# Patient Record
Sex: Male | Born: 1965 | Race: White | Hispanic: No | Smoking: Current every day smoker
Health system: Southern US, Community
[De-identification: ages and names within clinical notes are randomized; demographics above are authoritative.]

## PROBLEM LIST (undated history)

## (undated) DIAGNOSIS — K219 Gastro-esophageal reflux disease without esophagitis: Secondary | ICD-10-CM

## (undated) HISTORY — PX: TONSILLECTOMY: SUR1361

---

## 2005-06-21 ENCOUNTER — Emergency Department: Payer: Self-pay | Admitting: Emergency Medicine

## 2005-08-07 ENCOUNTER — Emergency Department: Payer: Self-pay | Admitting: Emergency Medicine

## 2008-12-24 ENCOUNTER — Emergency Department (HOSPITAL_COMMUNITY): Admission: EM | Admit: 2008-12-24 | Discharge: 2008-12-24 | Payer: Self-pay | Admitting: Emergency Medicine

## 2009-04-23 ENCOUNTER — Emergency Department (HOSPITAL_COMMUNITY): Admission: EM | Admit: 2009-04-23 | Discharge: 2009-04-23 | Payer: Self-pay | Admitting: Emergency Medicine

## 2009-09-19 ENCOUNTER — Emergency Department: Payer: Self-pay | Admitting: Emergency Medicine

## 2011-11-02 IMAGING — CT CT HEAD WITHOUT CONTRAST
2 series · 16 of 30 positions shown, 20 images · non-contrast
Comparison: none

REASON FOR EXAM: injury
COMMENTS:   LMP: (Male)

PROCEDURE:     CT  - CT HEAD WITHOUT CONTRAST  - September 19, 2009  [DATE]
RESULT:
HISTORY: Trauma.
PROCEDURE AND FINDINGS:  Standard nonenhanced CT was obtained. No mass
lesion is noted. There is no hydrocephalus. No hemorrhage. No bony
abnormality. Soft tissue swelling is noted over the left posterior parietal
region. No underlying fracture.

[Series 2: without · axial · non-contrast · 0.44mm/px · z∈[-37,+93]mm · 13 of 32 slices shown, 17 images]
[im 3/32  brain]
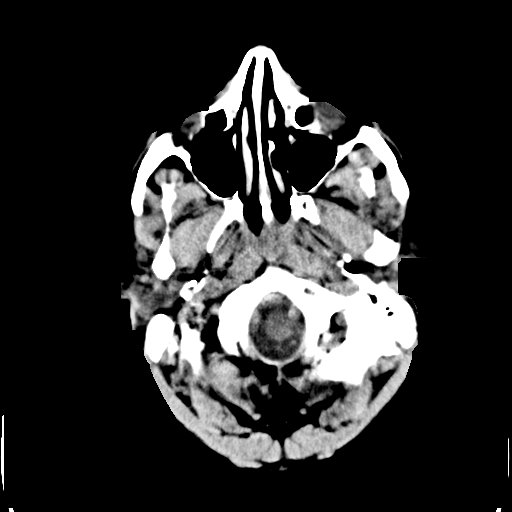
[im 3/32  bone]
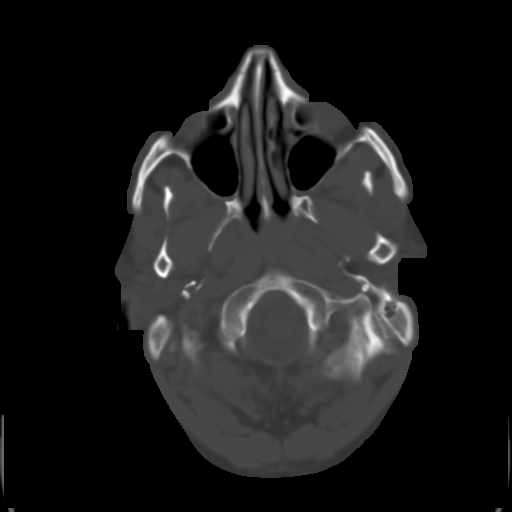
[im 5/32  brain]
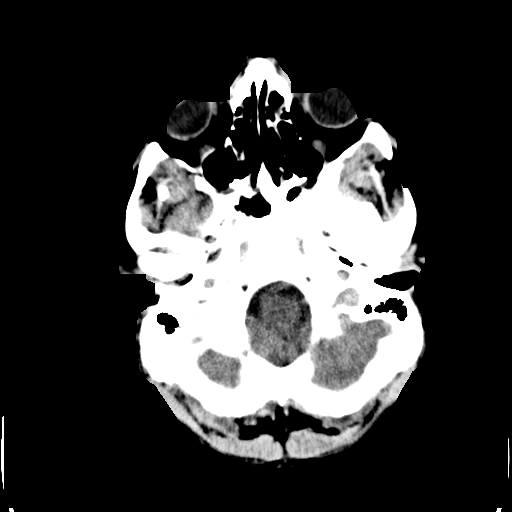
[im 7/32  brain]
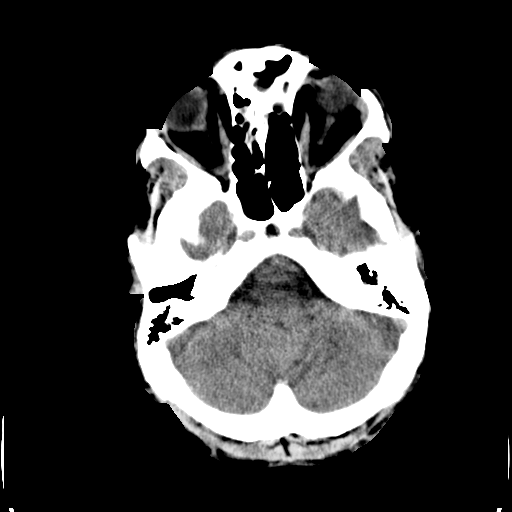
[im 9/32  brain]
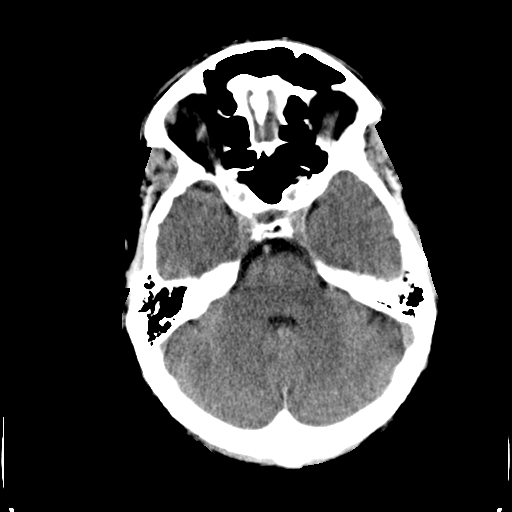
[im 12/32  brain]
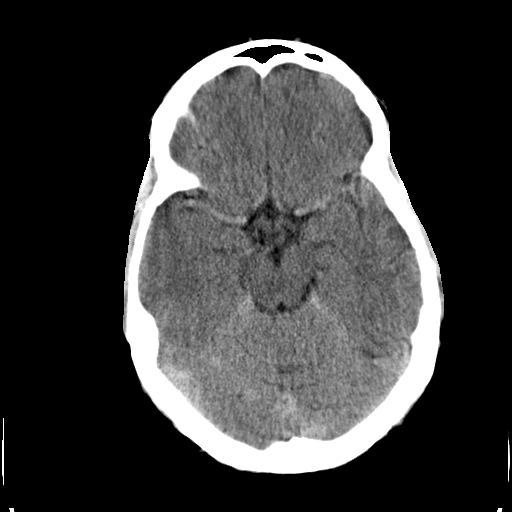
[im 12/32  bone]
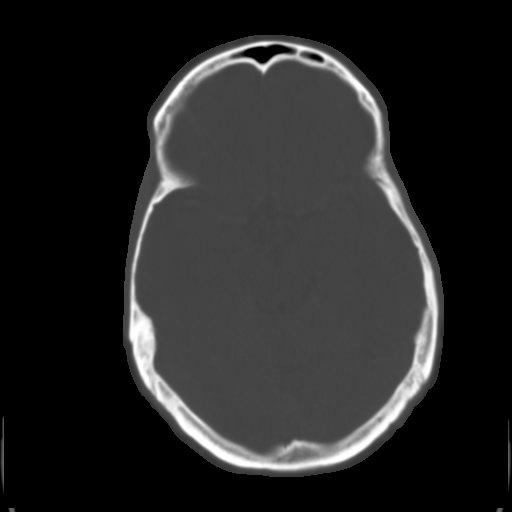
[im 14/32  brain]
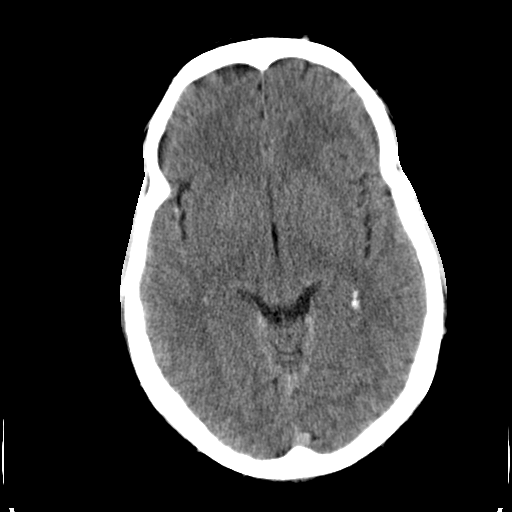
[im 16/32  brain]
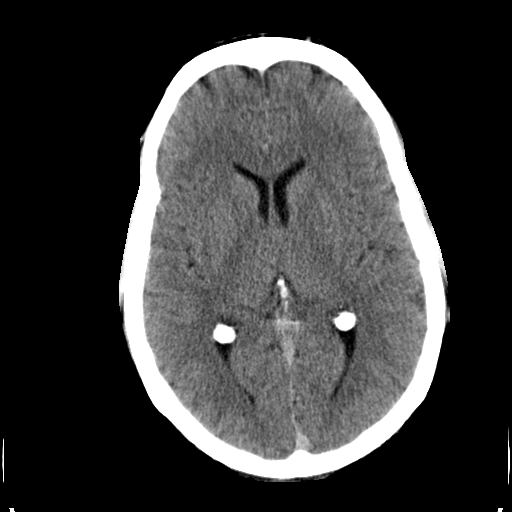
[im 18/32  brain]
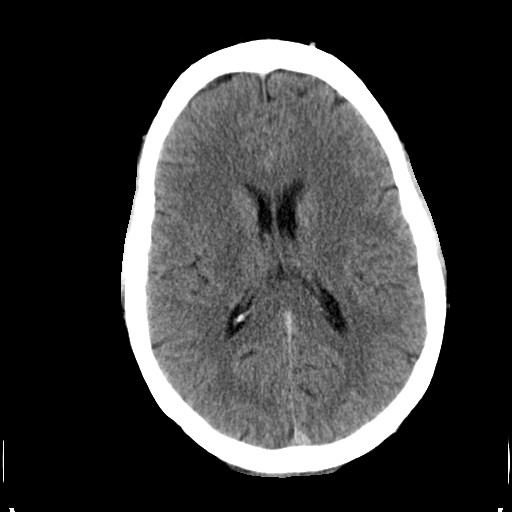
[im 20/32  brain]
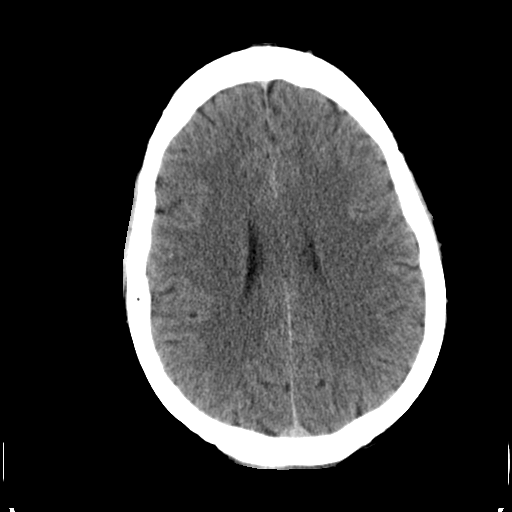
[im 20/32  bone]
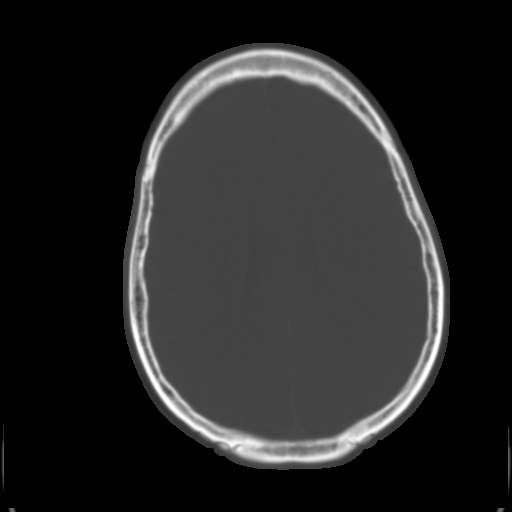
[im 23/32  brain]
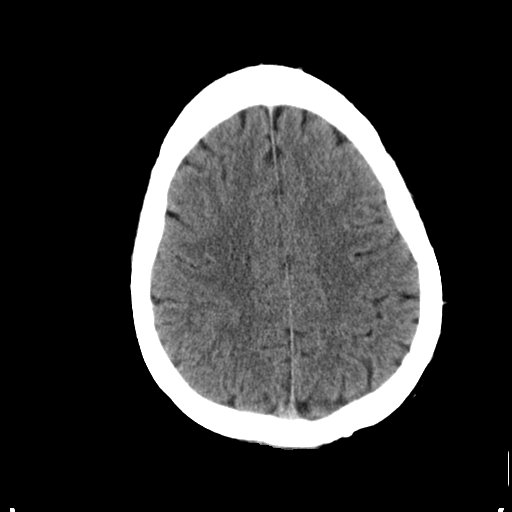
[im 25/32  brain]
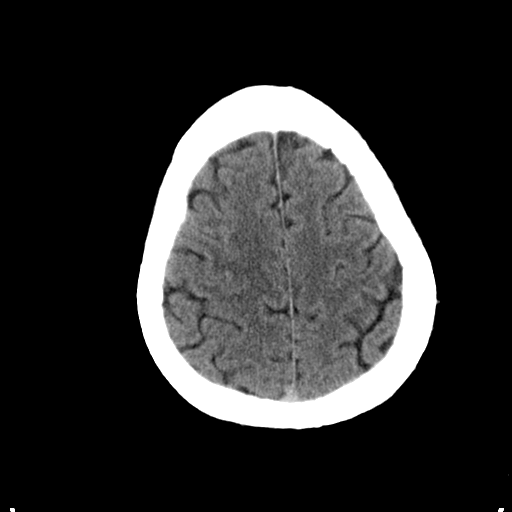
[im 27/32  brain]
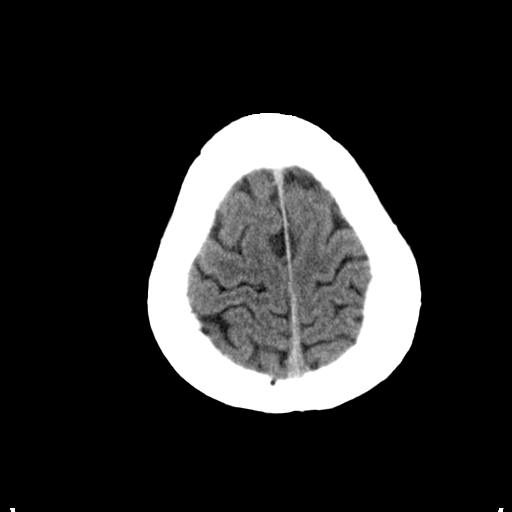
[im 29/32  brain]
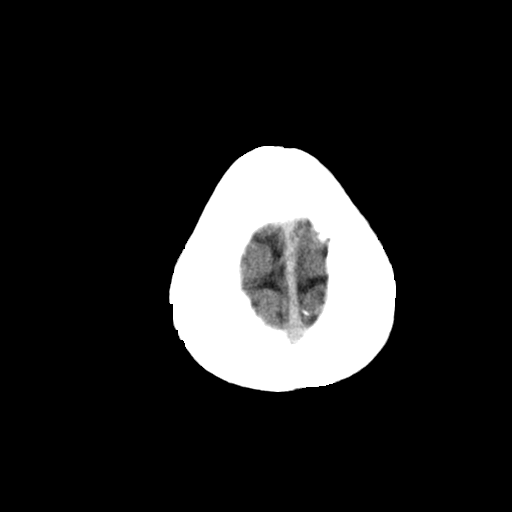
[im 29/32  bone]
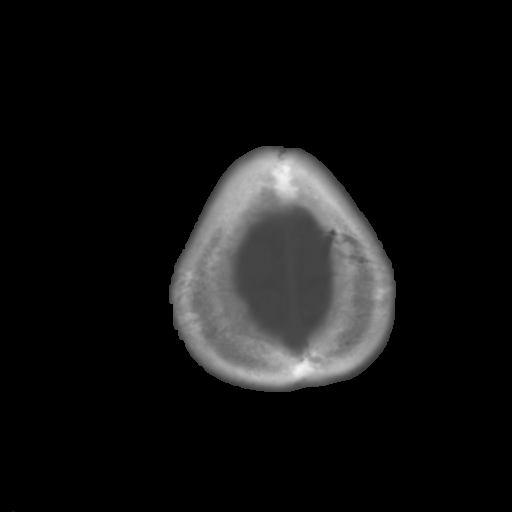

[Series 3: bone · axial · 0.44mm/px · z∈[-37,+8]mm · 3 of 32 slices shown]
[im 3/32  bone]
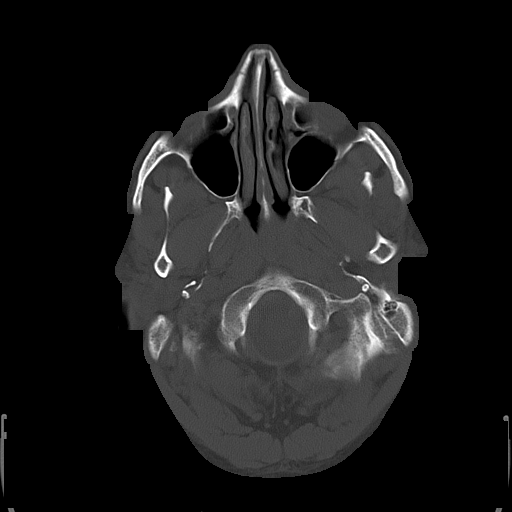
[im 7/32  bone]
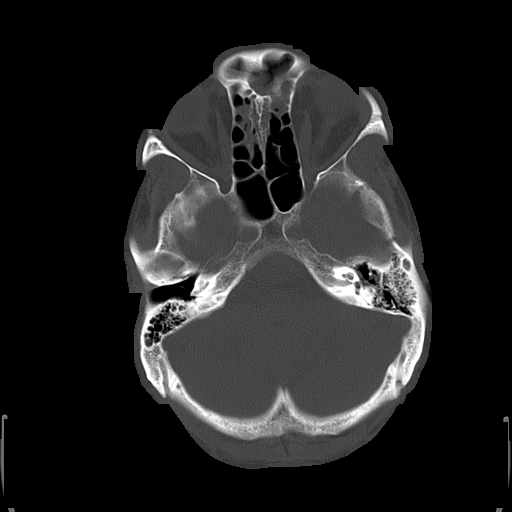
[im 12/32  bone]
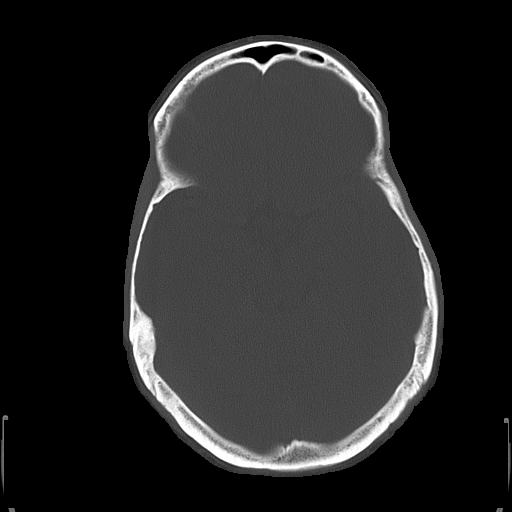

[16 of 30 positions shown; findings below may reference images not displayed]

IMPRESSION: No acute abnormality.

## 2017-05-27 ENCOUNTER — Emergency Department: Payer: Self-pay

## 2017-05-27 ENCOUNTER — Encounter: Payer: Self-pay | Admitting: Emergency Medicine

## 2017-05-27 ENCOUNTER — Inpatient Hospital Stay
Admission: EM | Admit: 2017-05-27 | Discharge: 2017-05-28 | DRG: 247 | Disposition: A | Discharge status: Home / Self Care | Payer: Self-pay | Attending: Internal Medicine | Admitting: Internal Medicine

## 2017-05-27 ENCOUNTER — Other Ambulatory Visit: Payer: Self-pay

## 2017-05-27 ENCOUNTER — Encounter
Admission: EM | Disposition: A | Discharge status: Home / Self Care | Payer: Self-pay | Source: Home / Self Care | Attending: Internal Medicine

## 2017-05-27 DIAGNOSIS — R778 Other specified abnormalities of plasma proteins: Secondary | ICD-10-CM

## 2017-05-27 DIAGNOSIS — F17210 Nicotine dependence, cigarettes, uncomplicated: Secondary | ICD-10-CM | POA: Diagnosis present

## 2017-05-27 DIAGNOSIS — R7989 Other specified abnormal findings of blood chemistry: Secondary | ICD-10-CM

## 2017-05-27 DIAGNOSIS — Z8249 Family history of ischemic heart disease and other diseases of the circulatory system: Secondary | ICD-10-CM

## 2017-05-27 DIAGNOSIS — E785 Hyperlipidemia, unspecified: Secondary | ICD-10-CM | POA: Diagnosis present

## 2017-05-27 DIAGNOSIS — Z8719 Personal history of other diseases of the digestive system: Secondary | ICD-10-CM

## 2017-05-27 DIAGNOSIS — I214 Non-ST elevation (NSTEMI) myocardial infarction: Principal | ICD-10-CM | POA: Diagnosis present

## 2017-05-27 DIAGNOSIS — R079 Chest pain, unspecified: Secondary | ICD-10-CM

## 2017-05-27 DIAGNOSIS — Z880 Allergy status to penicillin: Secondary | ICD-10-CM

## 2017-05-27 HISTORY — PX: CORONARY STENT INTERVENTION: CATH118234

## 2017-05-27 HISTORY — DX: Gastro-esophageal reflux disease without esophagitis: K21.9

## 2017-05-27 HISTORY — PX: LEFT HEART CATH AND CORONARY ANGIOGRAPHY: CATH118249

## 2017-05-27 LAB — CBC WITH DIFFERENTIAL/PLATELET
Basophils Absolute: 0.1 10*3/uL (ref 0–0.1)
Basophils Relative: 1 %
EOS ABS: 0.2 10*3/uL (ref 0–0.7)
EOS PCT: 2 %
HCT: 42.1 % (ref 40.0–52.0)
Hemoglobin: 14.4 g/dL (ref 13.0–18.0)
LYMPHS ABS: 4.5 10*3/uL — AB (ref 1.0–3.6)
LYMPHS PCT: 57 %
MCH: 28.6 pg (ref 26.0–34.0)
MCHC: 34.1 g/dL (ref 32.0–36.0)
MCV: 83.7 fL (ref 80.0–100.0)
MONO ABS: 0.8 10*3/uL (ref 0.2–1.0)
Monocytes Relative: 10 %
Neutro Abs: 2.4 10*3/uL (ref 1.4–6.5)
Neutrophils Relative %: 30 %
PLATELETS: 210 10*3/uL (ref 150–440)
RBC: 5.02 MIL/uL (ref 4.40–5.90)
RDW: 14.6 % — AB (ref 11.5–14.5)
WBC: 7.9 10*3/uL (ref 3.8–10.6)

## 2017-05-27 LAB — APTT: aPTT: 28 seconds (ref 24–36)

## 2017-05-27 LAB — COMPREHENSIVE METABOLIC PANEL
ALK PHOS: 91 U/L (ref 38–126)
ALT: 39 U/L (ref 17–63)
AST: 37 U/L (ref 15–41)
Albumin: 3.2 g/dL — ABNORMAL LOW (ref 3.5–5.0)
Anion gap: 7 (ref 5–15)
BUN: 12 mg/dL (ref 6–20)
CALCIUM: 8.5 mg/dL — AB (ref 8.9–10.3)
CHLORIDE: 108 mmol/L (ref 101–111)
CO2: 22 mmol/L (ref 22–32)
CREATININE: 0.98 mg/dL (ref 0.61–1.24)
GFR calc Af Amer: >60 mL/min (ref >60)
Glucose, Bld: 142 mg/dL — ABNORMAL HIGH (ref 65–99)
Potassium: 3.6 mmol/L (ref 3.5–5.1)
Sodium: 137 mmol/L (ref 135–145)
Total Bilirubin: 0.7 mg/dL (ref 0.3–1.2)
Total Protein: 5.9 g/dL — ABNORMAL LOW (ref 6.5–8.1)

## 2017-05-27 LAB — TROPONIN I
Troponin I: 0.56 ng/mL (ref <0.03)
Troponin I: 1.11 ng/mL (ref <0.03)
Troponin I: 46.21 ng/mL (ref <0.03)

## 2017-05-27 LAB — POCT ACTIVATED CLOTTING TIME: Activated Clotting Time: 428 s

## 2017-05-27 LAB — PROTIME-INR
INR: 1.03
Prothrombin Time: 13.4 seconds (ref 11.4–15.2)

## 2017-05-27 SURGERY — LEFT HEART CATH AND CORONARY ANGIOGRAPHY
Anesthesia: Moderate Sedation

## 2017-05-27 MED ORDER — METOPROLOL TARTRATE 25 MG PO TABS
25.0000 mg | ORAL_TABLET | Freq: Two times a day (BID) | ORAL | Status: DC
  Administered 2017-05-27 – 2017-05-28 (×2): 25 mg via ORAL
  Filled 2017-05-27 (×2): qty 1

## 2017-05-27 MED ORDER — HEPARIN (PORCINE) IN NACL 2-0.9 UNIT/ML-% IJ SOLN
INTRAMUSCULAR | Status: AC
  Filled 2017-05-27: qty 500

## 2017-05-27 MED ORDER — ASPIRIN 81 MG PO CHEW
81.0000 mg | CHEWABLE_TABLET | Freq: Every day | ORAL | Status: DC
  Administered 2017-05-28: 81 mg via ORAL
  Filled 2017-05-27: qty 1

## 2017-05-27 MED ORDER — TICAGRELOR 90 MG PO TABS
ORAL_TABLET | ORAL | Status: AC
Start: 2017-05-27 — End: ?
  Filled 2017-05-27: qty 2

## 2017-05-27 MED ORDER — ACETAMINOPHEN 325 MG PO TABS
650.0000 mg | ORAL_TABLET | ORAL | Status: DC | PRN
  Administered 2017-05-28: 650 mg via ORAL
  Filled 2017-05-27: qty 2

## 2017-05-27 MED ORDER — ATORVASTATIN CALCIUM 80 MG PO TABS
80.0000 mg | ORAL_TABLET | Freq: Every day | ORAL | Status: DC
  Filled 2017-05-27: qty 1
  Filled 2017-05-27: qty 2

## 2017-05-27 MED ORDER — HEPARIN BOLUS VIA INFUSION
4000.0000 [IU] | Freq: Once | INTRAVENOUS | Status: AC
  Administered 2017-05-27: 4000 [IU] via INTRAVENOUS
  Filled 2017-05-27: qty 4000

## 2017-05-27 MED ORDER — SODIUM CHLORIDE 0.9% FLUSH
3.0000 mL | Freq: Two times a day (BID) | INTRAVENOUS | Status: DC
  Administered 2017-05-28: 3 mL via INTRAVENOUS

## 2017-05-27 MED ORDER — MIDAZOLAM HCL 2 MG/2ML IJ SOLN
INTRAMUSCULAR | Status: DC | PRN
  Administered 2017-05-27: 1 mg via INTRAVENOUS

## 2017-05-27 MED ORDER — NITROGLYCERIN 0.4 MG SL SUBL
0.4000 mg | SUBLINGUAL_TABLET | SUBLINGUAL | Status: DC | PRN
  Administered 2017-05-27 (×3): 0.4 mg via SUBLINGUAL
  Filled 2017-05-27 (×3): qty 1

## 2017-05-27 MED ORDER — IOPAMIDOL (ISOVUE-300) INJECTION 61%
INTRAVENOUS | Status: DC | PRN
  Administered 2017-05-27: 100 mL via INTRA_ARTERIAL

## 2017-05-27 MED ORDER — BIVALIRUDIN BOLUS VIA INFUSION - CUPID
INTRAVENOUS | Status: DC | PRN
  Administered 2017-05-27: 63.6 mg via INTRAVENOUS

## 2017-05-27 MED ORDER — HEPARIN (PORCINE) IN NACL 100-0.45 UNIT/ML-% IJ SOLN
1100.0000 [IU]/h | INTRAMUSCULAR | Status: DC
  Administered 2017-05-27: 1100 [IU]/h via INTRAVENOUS
  Filled 2017-05-27 (×2): qty 250

## 2017-05-27 MED ORDER — ONDANSETRON HCL 4 MG/2ML IJ SOLN: 4.0000 mg | Freq: Four times a day (QID) | INTRAMUSCULAR | Status: DC | PRN

## 2017-05-27 MED ORDER — ASPIRIN 81 MG PO CHEW
CHEWABLE_TABLET | ORAL | Status: AC
  Administered 2017-05-27: 14:00:00
  Filled 2017-05-27: qty 1

## 2017-05-27 MED ORDER — ONDANSETRON HCL 4 MG PO TABS: 4.0000 mg | ORAL_TABLET | Freq: Four times a day (QID) | ORAL | Status: DC | PRN

## 2017-05-27 MED ORDER — NITROGLYCERIN 1 MG/10 ML FOR IR/CATH LAB
INTRA_ARTERIAL | Status: DC | PRN
  Administered 2017-05-27: 100 ug via INTRACORONARY

## 2017-05-27 MED ORDER — LIDOCAINE HCL (PF) 1 % IJ SOLN
INTRAMUSCULAR | Status: AC
  Filled 2017-05-27: qty 30

## 2017-05-27 MED ORDER — NITROGLYCERIN 2 % TD OINT
0.5000 [in_us] | TOPICAL_OINTMENT | Freq: Four times a day (QID) | TRANSDERMAL | Status: DC
  Administered 2017-05-27 – 2017-05-28 (×2): 0.5 [in_us] via TOPICAL
  Filled 2017-05-27 (×2): qty 1

## 2017-05-27 MED ORDER — NITROGLYCERIN 5 MG/ML IV SOLN
INTRAVENOUS | Status: AC
  Filled 2017-05-27: qty 10

## 2017-05-27 MED ORDER — BIVALIRUDIN TRIFLUOROACETATE 250 MG IV SOLR
0.2500 mg/kg/h | INTRAVENOUS | Status: AC
  Filled 2017-05-27: qty 250

## 2017-05-27 MED ORDER — SODIUM CHLORIDE 0.9% FLUSH: 3.0000 mL | Freq: Two times a day (BID) | INTRAVENOUS | Status: DC

## 2017-05-27 MED ORDER — GI COCKTAIL ~~LOC~~
30.0000 mL | Freq: Once | ORAL | Status: AC
  Administered 2017-05-27: 30 mL via ORAL
  Filled 2017-05-27: qty 30

## 2017-05-27 MED ORDER — TICAGRELOR 90 MG PO TABS
ORAL_TABLET | ORAL | Status: DC | PRN
  Administered 2017-05-27: 180 mg via ORAL

## 2017-05-27 MED ORDER — TICAGRELOR 90 MG PO TABS
90.0000 mg | ORAL_TABLET | Freq: Two times a day (BID) | ORAL | Status: DC
  Administered 2017-05-27 – 2017-05-28 (×2): 90 mg via ORAL
  Filled 2017-05-27 (×2): qty 1

## 2017-05-27 MED ORDER — SODIUM CHLORIDE 0.9 % WEIGHT BASED INFUSION
1.0000 mL/kg/h | INTRAVENOUS | Status: AC
  Administered 2017-05-27: 1 mL/kg/h via INTRAVENOUS

## 2017-05-27 MED ORDER — MIDAZOLAM HCL 2 MG/2ML IJ SOLN
INTRAMUSCULAR | Status: AC
  Filled 2017-05-27: qty 2

## 2017-05-27 MED ORDER — ONDANSETRON HCL 4 MG/2ML IJ SOLN
4.0000 mg | Freq: Once | INTRAMUSCULAR | Status: AC | PRN
  Administered 2017-05-27: 4 mg via INTRAVENOUS
  Filled 2017-05-27: qty 2

## 2017-05-27 MED ORDER — SODIUM CHLORIDE 0.9% FLUSH: 3.0000 mL | INTRAVENOUS | Status: DC | PRN

## 2017-05-27 MED ORDER — SODIUM CHLORIDE 0.9 % WEIGHT BASED INFUSION: 3.0000 mL/kg/h | INTRAVENOUS | Status: DC

## 2017-05-27 MED ORDER — FENTANYL CITRATE (PF) 100 MCG/2ML IJ SOLN
INTRAMUSCULAR | Status: AC
  Filled 2017-05-27: qty 2

## 2017-05-27 MED ORDER — SODIUM CHLORIDE 0.9 % IV SOLN
INTRAVENOUS | Status: AC | PRN
  Administered 2017-05-27: 1.75 mg/kg/h via INTRAVENOUS

## 2017-05-27 MED ORDER — SODIUM CHLORIDE 0.9 % IV SOLN
INTRAVENOUS | Status: DC
  Administered 2017-05-27: 13:00:00 via INTRAVENOUS

## 2017-05-27 MED ORDER — ASPIRIN 81 MG PO CHEW: 81.0000 mg | CHEWABLE_TABLET | ORAL | Status: DC

## 2017-05-27 MED ORDER — DOCUSATE SODIUM 100 MG PO CAPS
100.0000 mg | ORAL_CAPSULE | Freq: Two times a day (BID) | ORAL | Status: DC
  Administered 2017-05-27 – 2017-05-28 (×2): 100 mg via ORAL
  Filled 2017-05-27 (×2): qty 1

## 2017-05-27 MED ORDER — BISACODYL 5 MG PO TBEC
5.0000 mg | DELAYED_RELEASE_TABLET | Freq: Every day | ORAL | Status: DC | PRN
  Filled 2017-05-27: qty 1

## 2017-05-27 MED ORDER — SODIUM CHLORIDE 0.9 % IV SOLN: 250.0000 mL | INTRAVENOUS | Status: DC | PRN

## 2017-05-27 MED ORDER — MORPHINE SULFATE (PF) 4 MG/ML IV SOLN
4.0000 mg | Freq: Once | INTRAVENOUS | Status: AC | PRN
  Administered 2017-05-27: 4 mg via INTRAVENOUS
  Filled 2017-05-27: qty 1

## 2017-05-27 MED ORDER — LABETALOL HCL 5 MG/ML IV SOLN: 10.0000 mg | INTRAVENOUS | Status: AC | PRN

## 2017-05-27 MED ORDER — ACETAMINOPHEN 325 MG PO TABS: 650.0000 mg | ORAL_TABLET | Freq: Four times a day (QID) | ORAL | Status: DC | PRN

## 2017-05-27 MED ORDER — FENTANYL CITRATE (PF) 100 MCG/2ML IJ SOLN
INTRAMUSCULAR | Status: DC | PRN
  Administered 2017-05-27: 25 ug via INTRAVENOUS

## 2017-05-27 MED ORDER — ACETAMINOPHEN 650 MG RE SUPP: 650.0000 mg | Freq: Four times a day (QID) | RECTAL | Status: DC | PRN

## 2017-05-27 MED ORDER — BIVALIRUDIN TRIFLUOROACETATE 250 MG IV SOLR
INTRAVENOUS | Status: AC
Start: 2017-05-27 — End: ?
  Filled 2017-05-27: qty 250

## 2017-05-27 MED ORDER — SODIUM CHLORIDE 0.9 % WEIGHT BASED INFUSION: 1.0000 mL/kg/h | INTRAVENOUS | Status: DC

## 2017-05-27 MED ORDER — BIVALIRUDIN TRIFLUOROACETATE 250 MG IV SOLR
INTRAVENOUS | Status: AC
  Filled 2017-05-27: qty 250

## 2017-05-27 SURGICAL SUPPLY — 18 items
BALLN TREK RX 2.5X12 (BALLOONS) ×3
BALLN ~~LOC~~ TREK RX 3.0X12 (BALLOONS) ×3
BALLOON TREK RX 2.5X12 (BALLOONS) ×1 IMPLANT
BALLOON ~~LOC~~ TREK RX 3.0X12 (BALLOONS) ×1 IMPLANT
CATH INFINITI 5FR ANG PIGTAIL (CATHETERS) ×3 IMPLANT
CATH INFINITI 5FR JL4 (CATHETERS) ×3 IMPLANT
CATH INFINITI JR4 5F (CATHETERS) ×3 IMPLANT
CATH VISTA GUIDE 6FR XB3.5 (CATHETERS) ×3 IMPLANT
DEVICE CLOSURE MYNXGRIP 6/7F (Vascular Products) ×3 IMPLANT
DEVICE INFLAT 30 PLUS (MISCELLANEOUS) ×3 IMPLANT
KIT MANI 3VAL PERCEP (MISCELLANEOUS) ×3 IMPLANT
NEEDLE PERC 18GX7CM (NEEDLE) ×3 IMPLANT
PACK CARDIAC CATH (CUSTOM PROCEDURE TRAY) ×3 IMPLANT
SHEATH AVANTI 5FR X 11CM (SHEATH) ×3 IMPLANT
SHEATH AVANTI 6FR X 11CM (SHEATH) ×3 IMPLANT
STENT SIERRA 2.50 X 15 MM (Permanent Stent) ×3 IMPLANT
WIRE EMERALD 3MM-J .035X150CM (WIRE) ×3 IMPLANT
WIRE RUNTHROUGH .014X180CM (WIRE) ×3 IMPLANT

## 2017-05-27 NOTE — ED Notes (Signed)
Pt up to bathroom at this time. Family at bedside. Will continue to monitor.

## 2017-05-27 NOTE — ED Notes (Signed)
MD made aware of critical troponin 

## 2017-05-27 NOTE — ED Notes (Signed)
Critical troponin called from the lab.  Troponin 0.56

## 2017-05-27 NOTE — Progress Notes (Signed)
ANTICOAGULATION CONSULT NOTE - Initial Consult  Pharmacy Consult for bivalirudin Indication: chest pain/ACS  Allergies  Allergen Reactions  . Penicillins Other (See Comments)    Has patient had a PCN reaction causing immediate rash, facial/tongue/throat swelling, SOB or lightheadedness with hypotension: No Has patient had a PCN reaction causing severe rash involving mucus membranes or skin necrosis: No Has patient had a PCN reaction that required hospitalization: No Has patient had a PCN reaction occurring within the last 10 years: No If all of the above answers are "NO", then may proceed with Cephalosporin use.    Patient Measurements: Height: 5\' 8"  (172.7 cm) Weight: 187 lb (84.8 kg) IBW/kg (Calculated) : 68.4 Heparin Dosing Weight:   Vital Signs: Temp: 98.2 F (36.8 C) (11/30 1422) Temp Source: Oral (11/30 0941) BP: 140/102 (11/30 1422) Pulse Rate: 73 (11/30 1422)  Labs: Recent Labs    05/27/17 0733 05/27/17 1129 05/27/17 1300  HGB 14.4  --   --   HCT 42.1  --   --   PLT 210  --   --   APTT  --   --  28  LABPROT  --   --  13.4  INR  --   --  1.03  CREATININE 0.98  --   --   TROPONINI <0.03 0.56* 1.11*    Estimated Creatinine Clearance: 96.7 mL/min (by C-G formula based on SCr of 0.98 mg/dL).   Medical History: Past Medical History:  Diagnosis Date  . GERD (gastroesophageal reflux disease)     Medications:  Infusions:  . sodium chloride 50 mL/hr at 05/27/17 1328  . sodium chloride    . [START ON 05/28/2017] sodium chloride     Followed by  . [START ON 05/28/2017] sodium chloride    . bivalirudin (ANGIOMAX) infusion 5 mg/mL (Cath Lab,ACS,PCI indication)    . heparin 1,100 Units/hr (05/27/17 1332)    Assessment: 51 yom with NSTEMI now post cath, pharmacy consulted to dose Angiomax x 2 hours post cath. Confirmed that heparin d/c has been ordered.   Goal of Therapy:  Monitor platelets by anticoagulation protocol: Yes   Plan:  Bivalirudin 0.25  mg/kg/hr x 2 hours post cath. Scheduled end time is 18:30.  Bruce FrostNathan A Alhaji Black, Pharm.D., BCPS Clinical Pharmacist 05/27/2017,4:24 PM

## 2017-05-27 NOTE — Consult Note (Signed)
[facility]  CARDIOLOGY CONSULT NOTE  Patient ID: Bruce BeaverJeffrey M Shull MRN: [id number] DOB/AGE: [birth year] 51 y.o.  Admit date: 05/27/2017 Referring Physician Dr. Shaune PollackLord Primary Physician   Primary Cardiologist   Reason for Consultation nstemi  HPI: pt is a 51 yo male with no prior cardiac problems who presented to the er with complaints of chest pain that occurred this am while have sexual intercourse. Pain radiated to his left arm and was 10/10 with diapharesis. Presented to er where ekg showed no injury current. Pain was relieved with ntg and morphine and gi cocktail. Initial troponin was normal. Subsequent troponin increased to 1.11.   Review of Systems  Constitutional: Negative.   HENT: Negative.   Eyes: Negative.   Respiratory: Negative.   Cardiovascular: Positive for chest pain.  Gastrointestinal: Negative.   Genitourinary: Negative.   Musculoskeletal: Negative.   Skin: Negative.   Neurological: Negative.   Endo/Heme/Allergies: Negative.   Psychiatric/Behavioral: Negative.     Past Medical History:  Diagnosis Date  . GERD (gastroesophageal reflux disease)     History reviewed. No pertinent family history.  Social History   Socioeconomic History  . Marital status: Divorced    Spouse name: Not on file  . Number of children: Not on file  . Years of education: Not on file  . Highest education level: Not on file  Social Needs  . Financial resource strain: Not on file  . Food insecurity - worry: Not on file  . Food insecurity - inability: Not on file  . Transportation needs - medical: Not on file  . Transportation needs - non-medical: Not on file  Occupational History  . Not on file  Tobacco Use  . Smoking status: Current Every Day Smoker    Packs/day: 0.50    Types: Cigarettes  Substance and Sexual Activity  . Alcohol use: No    Frequency: Never  . Drug use: No  . Sexual activity: Yes  Other Topics Concern  .  Not on file  Social History Narrative  . Not on file      No medications prior to admission.    Physical Exam: Blood pressure (!) 140/102, pulse 73, temperature 98.2 F (36.8 C), resp. rate 18, height 5\' 8"  (1.727 m), weight 84.8 kg (187 lb), SpO2 97 %.   Wt Readings from Last 1 Encounters:  05/27/17 84.8 kg (187 lb)     General appearance: alert and cooperative Resp: clear to auscultation bilaterally Chest wall: no tenderness Cardio: regular rate and rhythm GI: soft, non-tender; bowel sounds normal; no masses,  no organomegaly Extremities: extremities normal, atraumatic, no cyanosis or edema Neurologic: Grossly normal  Labs:   Lab Results  Component Value Date   WBC 7.9 05/27/2017   HGB 14.4 05/27/2017   HCT 42.1 05/27/2017   MCV 83.7 05/27/2017   PLT 210 05/27/2017    Recent Labs  Lab 05/27/17 0733  NA 137  K 3.6  CL 108  CO2 22  BUN 12  CREATININE 0.98  CALCIUM 8.5*  PROT 5.9*  BILITOT 0.7  ALKPHOS 91  ALT 39  AST 37  GLUCOSE 142*   Lab Results  Component Value Date   TROPONINI 1.11 (HH) 05/27/2017      Radiology: no acute cardiopulmonary disease EKG: nsr with no ischemia  ASSESSMENT AND PLAN:  Pt with no prior cardiac history who presented with chest pain. Has ruled in for nstemi. Proceed with left heart cath due  to rest pain and elevated troponin. Has received heparin and asa. Further recs after cath.   Signed: Dalia HeadingKenneth A Cyrah Mclamb MD, [facility] 05/27/2017, 3:42 PM

## 2017-05-27 NOTE — Progress Notes (Signed)
12 lead EKG done. Right groin clean, dry, intact, without hematoma, edema, drainage, ecchymosis,.erythema. Pt. Denies c/o CP,SOB, N/V, dizziness, HA on arrival from cath lab.

## 2017-05-27 NOTE — H&P (Signed)
[facility] [place] [facility]   PATIENT NAME: Bruce Black    MR#:  [id number]  DATE OF BIRTH:  [birth year]  DATE OF ADMISSION:  05/27/2017  PRIMARY CARE PHYSICIAN: Patient, No Pcp Per   REQUESTING/REFERRING PHYSICIAN: Dr.Rebecca Lord  CHIEF COMPLAINT: Chest pain   Chief Complaint  Patient presents with  . Chest Pain    HISTORY OF PRESENT ILLNESS:  Bruce Black  is a 51 y.o. male no significant past medical history presented with chest pressure, started around 6 AM.  Patient had central chest pressure associated with nausea, sweating, dizziness.  Chest pressure was 8 out of 10 in severity radiated to the left arm.  EMS gave 4 baby aspirin, in the ER patient received 3 sublingual nitro, GI cocktail without much improvement of chest pain.  First set of troponins are negative but second troponin at 0.56.  So ER physician spoke with Dr. Lady GaryFath who is on-call for cardiology, recommended heparin drip, admission.  PAST MEDICAL HISTORY:   Past Medical History:  Diagnosis Date  . GERD (gastroesophageal reflux disease)     PAST SURGICAL HISTOIRY:   none SOCIAL HISTORY:   Social History   Tobacco Use  . Smoking status: Current Every Day Smoker    Packs/day: 0.50    Types: Cigarettes  Substance Use Topics  . Alcohol use: No    Frequency: Never    FAMILY HISTORY:  History reviewed. No pertinent family history. History significant for both mother and father had heart attacks above age 49, father had coronary artery disease with stent placement. DRUG ALLERGIES:   Allergies  Allergen Reactions  . Penicillins Other (See Comments)    Has patient had a PCN reaction causing immediate rash, facial/tongue/throat swelling, SOB or lightheadedness with hypotension: No Has patient had a PCN reaction causing severe rash involving mucus membranes or skin necrosis: No Has patient had a PCN reaction that required hospitalization: No Has patient had a PCN  reaction occurring within the last 10 years: No If all of the above answers are "NO", then may proceed with Cephalosporin use.    REVIEW OF SYSTEMS:  CONSTITUTIONAL: No fever, fatigue or weakness.  EYES: No blurred or double vision.  EARS, NOSE, AND THROAT: No tinnitus or ear pain.  RESPIRATORY: No cough, shortness of breath, wheezing or hemoptysis.  CARDIOVASCULAR: Chest pain, nausea and vomiting today morning.  GASTROINTESTINAL: No nausea, vomiting, diarrhea or abdominal pain.  GENITOURINARY: No dysuria, hematuria.  ENDOCRINE: No polyuria, nocturia,  HEMATOLOGY: No anemia, easy bruising or bleeding SKIN: No rash or lesion. MUSCULOSKELETAL: No joint pain or arthritis.   NEUROLOGIC: No tingling, numbness, weakness.  PSYCHIATRY: No anxiety or depression.   MEDICATIONS AT HOME:   Prior to Admission medications   Not on File      VITAL SIGNS:  Blood pressure (!) 146/98, pulse 74, temperature 98.7 F (37.1 C), temperature source Oral, resp. rate 20, height 5\' 8"  (1.727 m), weight 84.8 kg (187 lb), SpO2 99 %.  PHYSICAL EXAMINATION:  GENERAL:  51 y.o.-year-old patient lying in the bed with no acute distress.  EYES: Pupils equal, round, reactive to light and accommodation. No scleral icterus. Extraocular muscles intact.  HEENT: Head atraumatic, normocephalic. Oropharynx and nasopharynx clear.  NECK:  Supple, no jugular venous distention. No thyroid enlargement, no tenderness.  LUNGS: Normal breath sounds bilaterally, no wheezing, rales,rhonchi or crepitation. No use of accessory muscles of respiration.  CARDIOVASCULAR: S1, S2 normal. No murmurs, rubs, or gallops.  ABDOMEN:  Soft, nontender, nondistended. Bowel sounds present. No organomegaly or mass.  EXTREMITIES: No pedal edema, cyanosis, or clubbing.  NEUROLOGIC: Cranial nerves II through XII are intact. Muscle strength 5/5 in all extremities. Sensation intact. Gait not checked.  PSYCHIATRIC: The patient is alert and oriented x 3.   SKIN: No obvious rash, lesion, or ulcer.   LABORATORY PANEL:   CBC Recent Labs  Lab 05/27/17 0733  WBC 7.9  HGB 14.4  HCT 42.1  PLT 210   ------------------------------------------------------------------------------------------------------------------  Chemistries  Recent Labs  Lab 05/27/17 0733  NA 137  K 3.6  CL 108  CO2 22  GLUCOSE 142*  BUN 12  CREATININE 0.98  CALCIUM 8.5*  AST 37  ALT 39  ALKPHOS 91  BILITOT 0.7   ------------------------------------------------------------------------------------------------------------------  Cardiac Enzymes Recent Labs  Lab 05/27/17 1129  TROPONINI 0.56*   ------------------------------------------------------------------------------------------------------------------  RADIOLOGY:  Dg Chest 2 View  Result Date: 05/27/2017 CLINICAL DATA:  Chest pressure. EXAM: CHEST  2 VIEW COMPARISON:  No prior through FINDINGS: Mediastinum hilar structures normal. Lungs are clear. No pleural effusion or pneumothorax. Heart size normal. Sliding hiatal hernia. IMPRESSION: 1. No acute cardiopulmonary disease. 2. Sliding hiatal hernia. Electronically Signed   By: Maisie Fushomas  Register   On: 05/27/2017 08:18    EKG:   Orders placed or performed during the hospital encounter of 05/27/17  . EKG 12-Lead  . EKG 12-Lead   EKG shows normal sinus rhythm at 74 bpm, no ST-T changes.  IMPRESSION AND PLAN:   51 year old male patient without any significant past medical history, no PCP comes in because of chest pressure associated nausea, vomiting and elevated troponins concerning ACS.  Continue to cycle troponins, admitted to telemetry, continue aspirin, beta-blockers, nitrates, check fasting lipids, obtain cardiology consult, spoke with Dr. Lady GaryFath, continue heparin drip.  Discussed the plan with patient and patient's family.    All the records are reviewed and case discussed with ED provider. Management plans discussed with the patient, family  and they are in agreement.  CODE STATUS: full  TOTAL TIME TAKING CARE OF THIS PATIENT: 55minutes.    Katha HammingSnehalatha Siri Buege M.D on 05/27/2017 at 1:20 PM  Between 7am to 6pm - Pager - [phone]  After 6pm go to [email or web address] - password [email or web address]  [facility]  Office  [phone]  CC: Primary care physician; Patient, No Pcp Per  Note: This dictation was prepared with Dragon dictation along with smaller phrase technology. Any transcriptional errors that result from this process are unintentional.

## 2017-05-27 NOTE — Progress Notes (Signed)
Report given to floor RN, Nickie RetortBernaice. Immediate report given to Wynona Caneshristine, [place] in [facility]. Pt. In no acute distress.

## 2017-05-27 NOTE — ED Notes (Signed)
Pt family member requesting an update.  MD at bedside now

## 2017-05-27 NOTE — ED Notes (Signed)
Admitting doctor at bedside 

## 2017-05-27 NOTE — Progress Notes (Signed)
Report received from Robin, RN.

## 2017-05-27 NOTE — ED Provider Notes (Signed)
[facility] Emergency Department Provider Note ____________________________________________   I have reviewed the triage vital signs and the triage nursing note.  HISTORY  Chief Complaint Chest Pain   Historian Patient  HPI Bruce Black is a 51 y.o. male no significant past medical history, presents with chest pressure that started around 6 AM.  Patient states he had just finished having sex with his girlfriend and started to feel central chest pressure with nausea.  He states he sat in a chair for about 15 minutes and continued to feel bad.  No trouble breathing, no pleuritic chest pain.  He felt numb and tingly in finger tips of both hands.  States he had does have a history of GERD, but this did not feel like heartburn to him.  No known past coronary artery disease or heart attack.  He came in by 911 ambulance, and received 324 milligrams aspirin on the way in.  Patient states he still has chest heaviness "like an elephant sitting on my chest.  "He rates this at about 8 out of 10.  He states he is having some "hot flashes "and feels a little sweaty.   Past Medical History:  Diagnosis Date  . GERD (gastroesophageal reflux disease)     There are no active problems to display for this patient.     Prior to Admission medications   Not on File    Allergies  Allergen Reactions  . Penicillins Other (See Comments)    Has patient had a PCN reaction causing immediate rash, facial/tongue/throat swelling, SOB or lightheadedness with hypotension: No Has patient had a PCN reaction causing severe rash involving mucus membranes or skin necrosis: No Has patient had a PCN reaction that required hospitalization: No Has patient had a PCN reaction occurring within the last 10 years: No If all of the above answers are "NO", then may proceed with Cephalosporin use.    History reviewed. No pertinent family history.  Social History Social History   Tobacco Use   . Smoking status: Current Every Day Smoker    Packs/day: 0.50    Types: Cigarettes  Substance Use Topics  . Alcohol use: No    Frequency: Never  . Drug use: No    Review of Systems  Constitutional: Negative for fever. Eyes: Negative for visual changes. ENT: Negative for sore throat. Cardiovascular: Positive for chest sugar. Respiratory: Negative for shortness of breath. Gastrointestinal: Positive for nausea.  Negative for abdominal pain, vomiting and diarrhea. Genitourinary: Negative for dysuria. Musculoskeletal: Negative for back pain. Skin: Negative for rash. Neurological: Negative for headache.  ____________________________________________   PHYSICAL EXAM:  VITAL SIGNS: ED Triage Vitals  Enc Vitals Group     BP 05/27/17 0729 (!) 141/96     Pulse Rate 05/27/17 0729 74     Resp 05/27/17 0729 20     Temp 05/27/17 0729 98.9 F (37.2 C)     Temp Source 05/27/17 0729 Oral     SpO2 05/27/17 0729 96 %     Weight 05/27/17 0730 187 lb (84.8 kg)     Height 05/27/17 0730 5\' 8"  (1.727 m)     Head Circumference --      Peak Flow --      Pain Score 05/27/17 0725 8     Pain Loc --      Pain Edu? --      Excl. in GC? --      Constitutional: Alert and oriented. Well appearing and in no  distress. HEENT   Head: Normocephalic and atraumatic.  Slightly diaphoretic around the forehead.      Eyes: Conjunctivae are normal. Pupils equal and round.       Ears:         Nose: No congestion/rhinnorhea.   Mouth/Throat: Mucous membranes are moist.   Neck: No stridor. Cardiovascular/Chest: Normal rate, regular rhythm.  No murmurs, rubs, or gallops. Respiratory: Normal respiratory effort without tachypnea nor retractions. Breath sounds are clear and equal bilaterally. No wheezes/rales/rhonchi. Gastrointestinal: Soft. No distention, no guarding, no rebound. Nontender.    Genitourinary/rectal:Deferred Musculoskeletal: Nontender with normal range of motion in all extremities. No  joint effusions.  No lower extremity tenderness.  No edema. Neurologic:  Normal speech and language. No gross or focal neurologic deficits are appreciated. Skin:  Skin is warm, dry and intact. No rash noted. Psychiatric: Mood and affect are normal. Speech and behavior are normal. Patient exhibits appropriate insight and judgment.   ____________________________________________  LABS (pertinent positives/negatives) I, Governor Rooksebecca Arabel Barcenas, MD the attending physician have reviewed the labs noted below.  Labs Reviewed  COMPREHENSIVE METABOLIC PANEL - Abnormal; Notable for the following components:      Result Value   Glucose, Bld 142 (*)    Calcium 8.5 (*)    Total Protein 5.9 (*)    Albumin 3.2 (*)    All other components within normal limits  CBC WITH DIFFERENTIAL/PLATELET - Abnormal; Notable for the following components:   RDW 14.6 (*)    Lymphs Abs 4.5 (*)    All other components within normal limits  TROPONIN I - Abnormal; Notable for the following components:   Troponin I 0.56 (*)    All other components within normal limits  TROPONIN I    ____________________________________________    EKG I, Governor Rooksebecca Camaron Cammack, MD, the attending physician have personally viewed and interpreted all ECGs.  74 bpm.  normal sinus rhythm.  Left axis deviation.  Nonspecific ST and T wave. ____________________________________________  RADIOLOGY All Xrays were viewed by me.  Imaging interpreted by Radiologist, and I, Governor Rooksebecca Willadeen Colantuono, MD the attending physician have reviewed the radiologist interpretation noted below.  Chest x-ray 2 view:  IMPRESSION: 1. No acute cardiopulmonary disease.  2. Sliding hiatal hernia. __________________________________________  PROCEDURES  Procedure(s) performed: None  Critical Care performed: None   ____________________________________________  ED COURSE / ASSESSMENT AND PLAN  Pertinent labs & imaging results that were available during my care of the patient were  reviewed by me and considered in my medical decision making (see chart for details).   Patient with nonspecific chest pressure, associated with diaphoresis and nausea, but no focal chest pain, pleuritic chest pain, breathing difficulties, and complaints of bilateral tingling in his fingertips which is at this point essentially resolved, but the chest pressure still present.  Denies abdominal pain and no abdominal pain on exam.   Patient had minimal relief after 3 sublingual nitros, so morphine was tried.  Studies came back reassuring and initial troponin negative.  I discussed with patient sending her for a repeat.  Patient did have some relief with morphine but was still complaining of discomfort.  GI cocktail was tried and seemed to provide some relief.  I was told by the nurse after pharmacy called with critical value troponin 0 0.56.  I went in to speak with the patient about repeat troponin going up.  At this point he is actually pain controlled, no chest pain at rest.  He states it hurts a little bit if he  presses directly in his chest.  I spoke with Dr. Lady GaryFath, cardiology on-call, we decided to proceed with heparin, and patient was seen by Dr. Lady GaryFath this afternoon.  Patient be admitted to the hospitalist service.    DIFFERENTIAL DIAGNOSIS:Differential diagnosis includes, but is not limited to, ACS, aortic dissection, pulmonary embolism, cardiac tamponade, pneumothorax, pneumonia, pericarditis, myocarditis, GI-related causes including esophagitis/gastritis, and musculoskeletal chest wall pain.    CONSULTATIONS: None   Patient / Family / Caregiver informed of clinical course, medical decision-making process, and agree with plan.         ___________________________________________   FINAL CLINICAL IMPRESSION(S) / ED DIAGNOSES   Final diagnoses:  Nonspecific chest pain  Troponin I above reference range       ___________________________________________        Note: This dictation was prepared with Dragon dictation. Any transcriptional errors that result from this process are unintentional    Governor RooksLord, Phoenix Dresser, MD 05/27/17 1251

## 2017-05-27 NOTE — ED Notes (Signed)
RN informed 2A that pt was going to Cath Lab before going upstairs

## 2017-05-27 NOTE — Progress Notes (Signed)
ANTICOAGULATION CONSULT NOTE - Initial Consult  Pharmacy Consult for heparin Indication: chest pain/ACS  Allergies  Allergen Reactions  . Penicillins Other (See Comments)    Has patient had a PCN reaction causing immediate rash, facial/tongue/throat swelling, SOB or lightheadedness with hypotension: No Has patient had a PCN reaction causing severe rash involving mucus membranes or skin necrosis: No Has patient had a PCN reaction that required hospitalization: No Has patient had a PCN reaction occurring within the last 10 years: No If all of the above answers are "NO", then may proceed with Cephalosporin use.    Patient Measurements: Height: 5\' 8"  (172.7 cm) Weight: 187 lb (84.8 kg) IBW/kg (Calculated) : 68.4 Heparin Dosing Weight: 84.8 kg   Vital Signs: Temp: 98.7 F (37.1 C) (11/30 0941) Temp Source: Oral (11/30 0941) BP: 146/98 (11/30 1203) Pulse Rate: 74 (11/30 1203)  Labs: Recent Labs    05/27/17 0733 05/27/17 1129  HGB 14.4  --   HCT 42.1  --   PLT 210  --   CREATININE 0.98  --   TROPONINI <0.03 0.56*    Estimated Creatinine Clearance: 96.7 mL/min (by C-G formula based on SCr of 0.98 mg/dL).   Medical History: Past Medical History:  Diagnosis Date  . GERD (gastroesophageal reflux disease)     Medications:   Patient is not taking any anticoagulants at home.   Assessment: 51 yo male admitted with chest pain. Patient has minimal PMH of GERD and is not currently taking any scheduled medications at home.  Goal of Therapy:  Heparin level 0.3-0.7 units/ml Monitor platelets by anticoagulation protocol: Yes   Plan:  Give 4000 units bolus x 1 Start heparin infusion at 1100 units/hr Check anti-Xa level in 6 hours and daily while on heparin Continue to monitor H&H and platelets  .Yolanda BonineHannah Lifsey, PharmD Pharmacy Resident 05/27/2017,12:59 PM

## 2017-05-27 NOTE — ED Triage Notes (Signed)
Pt to ER via [facility], he was having intercourse with girlfriends this morning and developed mid sternal chest pain. Pt reports that he felt hot inside. He did get vomit also.

## 2017-05-27 NOTE — ED Notes (Signed)
ED TO INPATIENT HANDOFF REPORT  Name/Age/Gender Ernest Mallick 51 y.o. male  Code Status    Code Status Orders  (From admission, onward)        Start     Ordered   05/27/17 1256  Full code  Continuous     05/27/17 1258    Code Status History    Date Active Date Inactive Code Status Order ID Comments User Context   This patient has a current code status but no historical code status.      Home/SNF/Other Home  Chief Complaint chest pain  Level of Care/Admitting Diagnosis ED Disposition    ED Disposition Condition [facility] Area: [place] [[id number]]  Level of Care: Telemetry [5]  Diagnosis: NSTEMI (non-ST elevation myocardial infarction) [facility]) [[id number]]  Admitting Physician: Epifanio Lesches [[id number]]  Attending Physician: Epifanio Lesches [[id number]]  Estimated length of stay: past midnight tomorrow  Certification:: I certify this patient will need inpatient services for at least 2 midnights  PT Class (Do Not Modify): Inpatient [101]  PT Acc Code (Do Not Modify): Private [1]       Medical History Past Medical History:  Diagnosis Date  . GERD (gastroesophageal reflux disease)     Allergies Allergies  Allergen Reactions  . Penicillins Other (See Comments)    Has patient had a PCN reaction causing immediate rash, facial/tongue/throat swelling, SOB or lightheadedness with hypotension: No Has patient had a PCN reaction causing severe rash involving mucus membranes or skin necrosis: No Has patient had a PCN reaction that required hospitalization: No Has patient had a PCN reaction occurring within the last 10 years: No If all of the above answers are "NO", then may proceed with Cephalosporin use.    IV Location/Drains/Wounds Patient Lines/Drains/Airways Status   Active Line/Drains/Airways    Name:   Placement date:   Placement time:   Site:   Days:   Peripheral IV 05/27/17 Right Forearm   05/27/17    1314    Forearm    less than 1          Labs/Imaging Results for orders placed or performed during the hospital encounter of 05/27/17 (from the past 48 hour(s))  Comprehensive metabolic panel     Status: Abnormal   Collection Time: 05/27/17  7:33 AM  Result Value Ref Range   Sodium 137 135 - 145 mmol/L   Potassium 3.6 3.5 - 5.1 mmol/L   Chloride 108 101 - 111 mmol/L   CO2 22 22 - 32 mmol/L   Glucose, Bld 142 (H) 65 - 99 mg/dL   BUN 12 6 - 20 mg/dL   Creatinine, Ser 0.98 0.61 - 1.24 mg/dL   Calcium 8.5 (L) 8.9 - 10.3 mg/dL   Total Protein 5.9 (L) 6.5 - 8.1 g/dL   Albumin 3.2 (L) 3.5 - 5.0 g/dL   AST 37 15 - 41 U/L   ALT 39 17 - 63 U/L   Alkaline Phosphatase 91 38 - 126 U/L   Total Bilirubin 0.7 0.3 - 1.2 mg/dL   GFR calc non Af Amer >60 >60 mL/min   GFR calc Af Amer >60 >60 mL/min    Comment: (NOTE) The eGFR has been calculated using the CKD EPI equation. This calculation has not been validated in all clinical situations. eGFR's persistently <60 mL/min signify possible Chronic Kidney Disease.    Anion gap 7 5 - 15  Troponin I     Status: None   Collection Time:  05/27/17  7:33 AM  Result Value Ref Range   Troponin I <0.03 <0.03 ng/mL  CBC with Differential     Status: Abnormal   Collection Time: 05/27/17  7:33 AM  Result Value Ref Range   WBC 7.9 3.8 - 10.6 K/uL   RBC 5.02 4.40 - 5.90 MIL/uL   Hemoglobin 14.4 13.0 - 18.0 g/dL   HCT 42.1 40.0 - 52.0 %   MCV 83.7 80.0 - 100.0 fL   MCH 28.6 26.0 - 34.0 pg   MCHC 34.1 32.0 - 36.0 g/dL   RDW 14.6 (H) 11.5 - 14.5 %   Platelets 210 150 - 440 K/uL   Neutrophils Relative % 30 %   Neutro Abs 2.4 1.4 - 6.5 K/uL   Lymphocytes Relative 57 %   Lymphs Abs 4.5 (H) 1.0 - 3.6 K/uL   Monocytes Relative 10 %   Monocytes Absolute 0.8 0.2 - 1.0 K/uL   Eosinophils Relative 2 %   Eosinophils Absolute 0.2 0 - 0.7 K/uL   Basophils Relative 1 %   Basophils Absolute 0.1 0 - 0.1 K/uL  Troponin I     Status: Abnormal   Collection Time: 05/27/17 11:29 AM   Result Value Ref Range   Troponin I 0.56 (HH) <0.03 ng/mL    Comment: CRITICAL RESULT CALLED TO, READ BACK BY AND VERIFIED WITH Jerald Kief RN AT 1225 05/27/17. MSS    Dg Chest 2 View  Result Date: 05/27/2017 CLINICAL DATA:  Chest pressure. EXAM: CHEST  2 VIEW COMPARISON:  No prior through FINDINGS: Mediastinum hilar structures normal. Lungs are clear. No pleural effusion or pneumothorax. Heart size normal. Sliding hiatal hernia. IMPRESSION: 1. No acute cardiopulmonary disease. 2. Sliding hiatal hernia. Electronically Signed   By: Marcello Moores  Register   On: 05/27/2017 08:18    Pending Labs Unresulted Labs (From admission, onward)   Start     Ordered   05/28/17 5732  Basic metabolic panel  Tomorrow morning,   STAT     05/27/17 1258   05/28/17 0500  CBC  Tomorrow morning,   STAT     05/27/17 1258   05/28/17 0500  Lipid panel  Tomorrow morning,   STAT     05/27/17 1258   05/27/17 1930  Heparin level (unfractionated)  Once-Timed,   STAT     05/27/17 1337   05/27/17 1257  Troponin I  Now then every 6 hours,   STAT     05/27/17 1258   05/27/17 1255  HIV antibody (Routine Testing)  Once,   STAT     05/27/17 1258   05/27/17 1253  APTT  STAT,   STAT     05/27/17 1252   05/27/17 1253  Protime-INR  STAT,   STAT     05/27/17 1252      Vitals/Pain Today's Vitals   05/27/17 0840 05/27/17 0936 05/27/17 0941 05/27/17 1203  BP:   (!) 145/101 (!) 146/98  Pulse:   70 74  Resp:   20 20  Temp:   98.7 F (37.1 C)   TempSrc:   Oral   SpO2:   96% 99%  Weight:      Height:      PainSc: 7  7  7       Isolation Precautions No active isolations  Medications Medications  nitroGLYCERIN (NITROSTAT) SL tablet 0.4 mg (0.4 mg Sublingual Given 05/27/17 0823)  heparin ADULT infusion 100 units/mL (25000 units/259m sodium chloride 0.45%) (1,100 Units/hr Intravenous New Bag/Given 05/27/17 1332)  0.9 %  sodium chloride infusion ( Intravenous New Bag/Given 05/27/17 1328)  acetaminophen (TYLENOL)  tablet 650 mg (not administered)    Or  acetaminophen (TYLENOL) suppository 650 mg (not administered)  docusate sodium (COLACE) capsule 100 mg (not administered)  bisacodyl (DULCOLAX) EC tablet 5 mg (not administered)  ondansetron (ZOFRAN) tablet 4 mg (not administered)    Or  ondansetron (ZOFRAN) injection 4 mg (not administered)  aspirin chewable tablet 81 mg (not administered)  metoprolol tartrate (LOPRESSOR) tablet 25 mg (not administered)  nitroGLYCERIN (NITROGLYN) 2 % ointment 0.5 inch (not administered)  morphine 4 MG/ML injection 4 mg (4 mg Intravenous Given 05/27/17 0903)  ondansetron (ZOFRAN) injection 4 mg (4 mg Intravenous Given 05/27/17 0752)  gi cocktail (Maalox,Lidocaine,Donnatal) (30 mLs Oral Given 05/27/17 0952)  heparin bolus via infusion 4,000 Units (4,000 Units Intravenous Bolus from Bag 05/27/17 1331)  aspirin 81 MG chewable tablet (  Given 05/27/17 1330)    Mobility walks

## 2017-05-27 NOTE — ED Notes (Signed)
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

## 2017-05-28 LAB — BASIC METABOLIC PANEL
Anion gap: 5 (ref 5–15)
BUN: 9 mg/dL (ref 6–20)
CALCIUM: 8 mg/dL — AB (ref 8.9–10.3)
CO2: 23 mmol/L (ref 22–32)
CREATININE: 0.85 mg/dL (ref 0.61–1.24)
Chloride: 109 mmol/L (ref 101–111)
GFR calc non Af Amer: >60 mL/min (ref >60)
Glucose, Bld: 119 mg/dL — ABNORMAL HIGH (ref 65–99)
Potassium: 3.7 mmol/L (ref 3.5–5.1)
Sodium: 137 mmol/L (ref 135–145)

## 2017-05-28 LAB — GLUCOSE, CAPILLARY: GLUCOSE-CAPILLARY: 105 mg/dL — AB (ref 65–99)

## 2017-05-28 LAB — CBC
HCT: 40.4 % (ref 40.0–52.0)
Hemoglobin: 13.8 g/dL (ref 13.0–18.0)
MCH: 28.2 pg (ref 26.0–34.0)
MCHC: 34.1 g/dL (ref 32.0–36.0)
MCV: 82.8 fL (ref 80.0–100.0)
PLATELETS: 209 10*3/uL (ref 150–440)
RBC: 4.88 MIL/uL (ref 4.40–5.90)
RDW: 14.3 % (ref 11.5–14.5)
WBC: 8.5 10*3/uL (ref 3.8–10.6)

## 2017-05-28 LAB — TROPONIN I
TROPONIN I: 12.51 ng/mL — AB (ref <0.03)
Troponin I: 24.62 ng/mL (ref <0.03)

## 2017-05-28 LAB — LIPID PANEL
Cholesterol: 169 mg/dL (ref 0–200)
HDL: 24 mg/dL — AB (ref >40)
LDL Cholesterol: 109 mg/dL — ABNORMAL HIGH (ref 0–99)
TRIGLYCERIDES: 180 mg/dL — AB (ref <150)
Total CHOL/HDL Ratio: 7 RATIO
VLDL: 36 mg/dL (ref 0–40)

## 2017-05-28 MED ORDER — METOPROLOL TARTRATE 25 MG PO TABS: 25.0000 mg | ORAL_TABLET | Freq: Two times a day (BID) | ORAL | 0 refills | Status: AC

## 2017-05-28 MED ORDER — TICAGRELOR 90 MG PO TABS: 90.0000 mg | ORAL_TABLET | Freq: Two times a day (BID) | ORAL | 2 refills | Status: AC

## 2017-05-28 MED ORDER — ATORVASTATIN CALCIUM 80 MG PO TABS: 80.0000 mg | ORAL_TABLET | Freq: Every day | ORAL | 2 refills | Status: AC

## 2017-05-28 MED ORDER — ASPIRIN 81 MG PO CHEW: 81.0000 mg | CHEWABLE_TABLET | Freq: Every day | ORAL | 2 refills | Status: AC

## 2017-05-28 MED ORDER — NITROGLYCERIN 0.4 MG SL SUBL: 0.4000 mg | SUBLINGUAL_TABLET | SUBLINGUAL | 2 refills | Status: AC | PRN

## 2017-05-28 NOTE — Progress Notes (Addendum)
Discharge instructions explained to pt/ verbalized an understanding/ iv and tele removed/ ambulated and tolerated well/ refused wheelchair and ambulated off unit with family/ coupon given for brilinta

## 2017-05-28 NOTE — Progress Notes (Signed)
[facility]  SUBJECTIVE: Post MI.  No chest pain.  Cath site is intact.  Anxious for discharge.   Vitals:   05/27/17 1834 05/27/17 2002 05/28/17 0553 05/28/17 0725  BP: 140/90 137/89 124/81 123/74  Pulse: 71 66 77 83  Resp: 18 18 17 18   Temp: 98.3 F (36.8 C) 98.6 F (37 C) 98.5 F (36.9 C)   TempSrc: Oral Oral Oral   SpO2: 100% 100% 97% 98%  Weight:   83.3 kg (183 lb 11.2 oz)   Height:        Intake/Output Summary (Last 24 hours) at 05/28/2017 1000 Last data filed at 05/28/2017 0800 Gross per 24 hour  Intake -  Output 1960 ml  Net -1960 ml    LABS: Basic Metabolic Panel: Recent Labs    05/27/17 0733 05/28/17 0123  NA 137 137  K 3.6 3.7  CL 108 109  CO2 22 23  GLUCOSE 142* 119*  BUN 12 9  CREATININE 0.98 0.85  CALCIUM 8.5* 8.0*   Liver Function Tests: Recent Labs    05/27/17 0733  AST 37  ALT 39  ALKPHOS 91  BILITOT 0.7  PROT 5.9*  ALBUMIN 3.2*   No results for input(s): LIPASE, AMYLASE in the last 72 hours. CBC: Recent Labs    05/27/17 0733 05/28/17 0123  WBC 7.9 8.5  NEUTROABS 2.4  --   HGB 14.4 13.8  HCT 42.1 40.4  MCV 83.7 82.8  PLT 210 209   Cardiac Enzymes: Recent Labs    05/27/17 1907 05/28/17 0123 05/28/17 0659  TROPONINI 46.21* 24.62* 12.51*   BNP: Invalid input(s): POCBNP D-Dimer: No results for input(s): DDIMER in the last 72 hours. Hemoglobin A1C: No results for input(s): HGBA1C in the last 72 hours. Fasting Lipid Panel: Recent Labs    05/28/17 0123  CHOL 169  HDL 24*  LDLCALC 109*  TRIG 180*  CHOLHDL 7.0   Thyroid Function Tests: No results for input(s): TSH, T4TOTAL, T3FREE, THYROIDAB in the last 72 hours.  Invalid input(s): FREET3 Anemia Panel: No results for input(s): VITAMINB12, FOLATE, FERRITIN, TIBC, IRON, RETICCTPCT in the last 72 hours.   Physical Exam: Blood pressure 123/74, pulse 83, temperature 98.5 F (36.9 C), temperature source Oral, resp.  rate 18, height 5\' 8"  (1.727 m), weight 83.3 kg (183 lb 11.2 oz), SpO2 98 %.   Wt Readings from Last 1 Encounters:  05/28/17 83.3 kg (183 lb 11.2 oz)     General appearance: alert and cooperative Resp: clear to auscultation bilaterally Cardio: regular rate and rhythm GI: soft, non-tender; bowel sounds normal; no masses,  no organomegaly Extremities: Cath site clean and dry.  No significant ecchymosis or bruit.  Distal pulses intact. Pulses: 2+ and symmetric Neurologic: Grossly normal  TELEMETRY: Reviewed telemetry pt in normal sinus rhythm:  ASSESSMENT AND PLAN:  Active Problems:   NSTEMI (non-ST elevation myocardial infarction) (HCC)-patient presented with chest pain.  He ruled in for non-ST elevation myocardial infarction.  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  He underwent placement of a drug-eluting stent in this distribution.  His serum troponin peaked at 46.21.  Currently 12.51.Marland Kitchen.  He is currently hemodynamically stable and asymptomatic.  Okay to discharge on aspirin 81 mg daily, Brilinta 90 mg twice daily, atorvastatin 80 mg daily, metoprolol tartrate 25 mg twice daily.  Patient should not drive or lift any heavy objects for at least 48 hours after discharge.  Will see back in the office next week for further recommendations.  Patient will need phase 2 cardiac rehab.  Ejection fraction is normal    Dalia HeadingKenneth A Melony Tenpas, MD, [facility] 05/28/2017 10:00 AM

## 2017-05-28 NOTE — Discharge Instructions (Signed)
Heart healthy diet. Smoking cessation. should not drive or lift any heavy objects for at least 48 hours after discharge.  need phase 2 cardiac rehab.

## 2017-05-28 NOTE — Discharge Summary (Addendum)
[facility] [place] [facility]   PATIENT NAME: Bruce MiresJeffrey Black    MR#:  [id number]  DATE OF BIRTH:  Dec 23, 1965  DATE OF ADMISSION:  05/27/2017   ADMITTING PHYSICIAN: Katha HammingSnehalatha Konidena, MD  DATE OF DISCHARGE: 05/28/2017 PRIMARY CARE PHYSICIAN: Bruce Black   ADMISSION DIAGNOSIS:  Troponin I above reference range [R74.8] Nonspecific chest pain [R07.9] DISCHARGE DIAGNOSIS:  Active Problems:   NSTEMI (non-ST elevation myocardial infarction) (HCC)  SECONDARY DIAGNOSIS:   Past Medical History:  Diagnosis Date  . GERD (gastroesophageal reflux disease)    HOSPITAL COURSE:  51 year old male patient without any significant past medical history, no PCP comes in because of chest pressure associated nausea, vomiting and elevated troponins concerning ACS.   1. NSTEMI:  The patient has been treated with heparin drip, aspirin, Lipitor and Lopressor. He got cardiac caths and stent placement.  Dr. Lady GaryFath started Brilinta.  2. HLP. LDL 109.  Started Lipitor.  3.  Tobacco abuse.  Smoking cessation was counseled for 3-4 minutes.  I discussed with Dr. Lady GaryFath. DISCHARGE CONDITIONS:   CONSULTS OBTAINED:  Treatment Team:  Dalia HeadingFath, Kenneth A, MD DRUG ALLERGIES:   Allergies  Allergen Reactions  . Penicillins Other (See Comments)    Has patient had a PCN reaction causing immediate rash, facial/tongue/throat swelling, SOB or lightheadedness with hypotension: No Has patient had a PCN reaction causing severe rash involving mucus membranes or skin necrosis: No Has patient had a PCN reaction that required hospitalization: No Has patient had a PCN reaction occurring within the last 10 years: No If all of the above answers are "NO", then may proceed with Cephalosporin use.   DISCHARGE MEDICATIONS:   Allergies as of 05/28/2017      Reactions   Penicillins Other (See Comments)   Has patient had a PCN reaction causing immediate rash, facial/tongue/throat swelling, SOB or  lightheadedness with hypotension: No Has patient had a PCN reaction causing severe rash involving mucus membranes or skin necrosis: No Has patient had a PCN reaction that required hospitalization: No Has patient had a PCN reaction occurring within the last 10 years: No If all of the above answers are "NO", then may proceed with Cephalosporin use.      Medication List    TAKE these medications   aspirin 81 MG chewable tablet Chew 1 tablet (81 mg total) by mouth daily.   atorvastatin 80 MG tablet Commonly known as:  LIPITOR Take 1 tablet (80 mg total) by mouth daily at 6 PM.   metoprolol tartrate 25 MG tablet Commonly known as:  LOPRESSOR Take 1 tablet (25 mg total) by mouth 2 (two) times daily.   nitroGLYCERIN 0.4 MG SL tablet Commonly known as:  NITROSTAT Place 1 tablet (0.4 mg total) under the tongue every 5 (five) minutes as needed for chest pain.   ticagrelor 90 MG Tabs tablet Commonly known as:  BRILINTA Take 1 tablet (90 mg total) by mouth 2 (two) times daily.        DISCHARGE INSTRUCTIONS:  See AVS. If you experience worsening of your admission symptoms, develop shortness of breath, life threatening emergency, suicidal or homicidal thoughts you must seek medical attention immediately by calling 911 or calling your MD immediately  if symptoms less severe.  You Must read complete instructions/literature along with all the possible adverse reactions/side effects for all the Medicines you take and that have been prescribed to you. Take any new Medicines after you have completely understood and accpet all the  possible adverse reactions/side effects.   Please note  You were cared for by a hospitalist during your hospital stay. If you have any questions about your discharge medications or the care you received while you were in the hospital after you are discharged, you can call the unit and asked to speak with the hospitalist on call if the hospitalist that took care of you  is not available. Once you are discharged, your primary care physician will handle any further medical issues. Please note that NO REFILLS for any discharge medications will be authorized once you are discharged, as it is imperative that you return to your primary care physician (or establish a relationship with a primary care physician if you do not have one) for your aftercare needs so that they can reassess your need for medications and monitor your lab values.    On the day of Discharge:  VITAL SIGNS:  Blood pressure 123/74, pulse 83, temperature 98.5 F (36.9 C), temperature source Oral, resp. rate 18, height 5\' 8"  (1.727 m), weight 183 lb 11.2 oz (83.3 kg), SpO2 98 %. PHYSICAL EXAMINATION:  GENERAL:  51 y.o.-year-old patient lying in the bed with no acute distress.  EYES: Pupils equal, round, reactive to light and accommodation. No scleral icterus. Extraocular muscles intact.  HEENT: Head atraumatic, normocephalic. Oropharynx and nasopharynx clear.  NECK:  Supple, no jugular venous distention. No thyroid enlargement, no tenderness.  LUNGS: Normal breath sounds bilaterally, no wheezing, rales,rhonchi or crepitation. No use of accessory muscles of respiration.  CARDIOVASCULAR: S1, S2 normal. No murmurs, rubs, or gallops.  ABDOMEN: Soft, non-tender, non-distended. Bowel sounds present. No organomegaly or mass.  EXTREMITIES: No pedal edema, cyanosis, or clubbing.  NEUROLOGIC: Cranial nerves II through XII are intact. Muscle strength 5/5 in all extremities. Sensation intact. Gait not checked.  PSYCHIATRIC: The patient is alert and oriented x 3.  SKIN: No obvious rash, lesion, or ulcer.  DATA REVIEW:   CBC Recent Labs  Lab 05/28/17 0123  WBC 8.5  HGB 13.8  HCT 40.4  PLT 209    Chemistries  Recent Labs  Lab 05/27/17 0733 05/28/17 0123  NA 137 137  K 3.6 3.7  CL 108 109  CO2 22 23  GLUCOSE 142* 119*  BUN 12 9  CREATININE 0.98 0.85  CALCIUM 8.5* 8.0*  AST 37  --   ALT 39   --   ALKPHOS 91  --   BILITOT 0.7  --      Microbiology Results  No results found for this or any previous visit.  RADIOLOGY:  No results found.   Management plans discussed with the patient, family and they are in agreement.  CODE STATUS: Full Code   TOTAL TIME TAKING CARE OF THIS PATIENT: 33 minutes.    Shaune PollackQing September Mormile M.D on 05/28/2017 at 11:55 AM  Between 7am to 6pm - Pager - [phone]  After 6pm go to [email or web address] - Social research officer, governmentpassword [email or web address]  [facility]  Office  [phone]  CC: Primary care physician; Bruce Black   Note: This dictation was prepared with Dragon dictation along with smaller phrase technology. Any transcriptional errors that result from this process are unintentional.

## 2017-05-29 LAB — HIV ANTIBODY (ROUTINE TESTING W REFLEX): HIV SCREEN 4TH GENERATION: NONREACTIVE

## 2017-05-30 ENCOUNTER — Encounter: Payer: Self-pay | Admitting: Cardiology

## 2019-07-10 IMAGING — CR DG CHEST 2V
2 series · 2 of 2 positions shown · non-contrast
Comparison: No prior through

CLINICAL DATA: Chest pressure.

EXAM:
CHEST  2 VIEW

[chest pa]
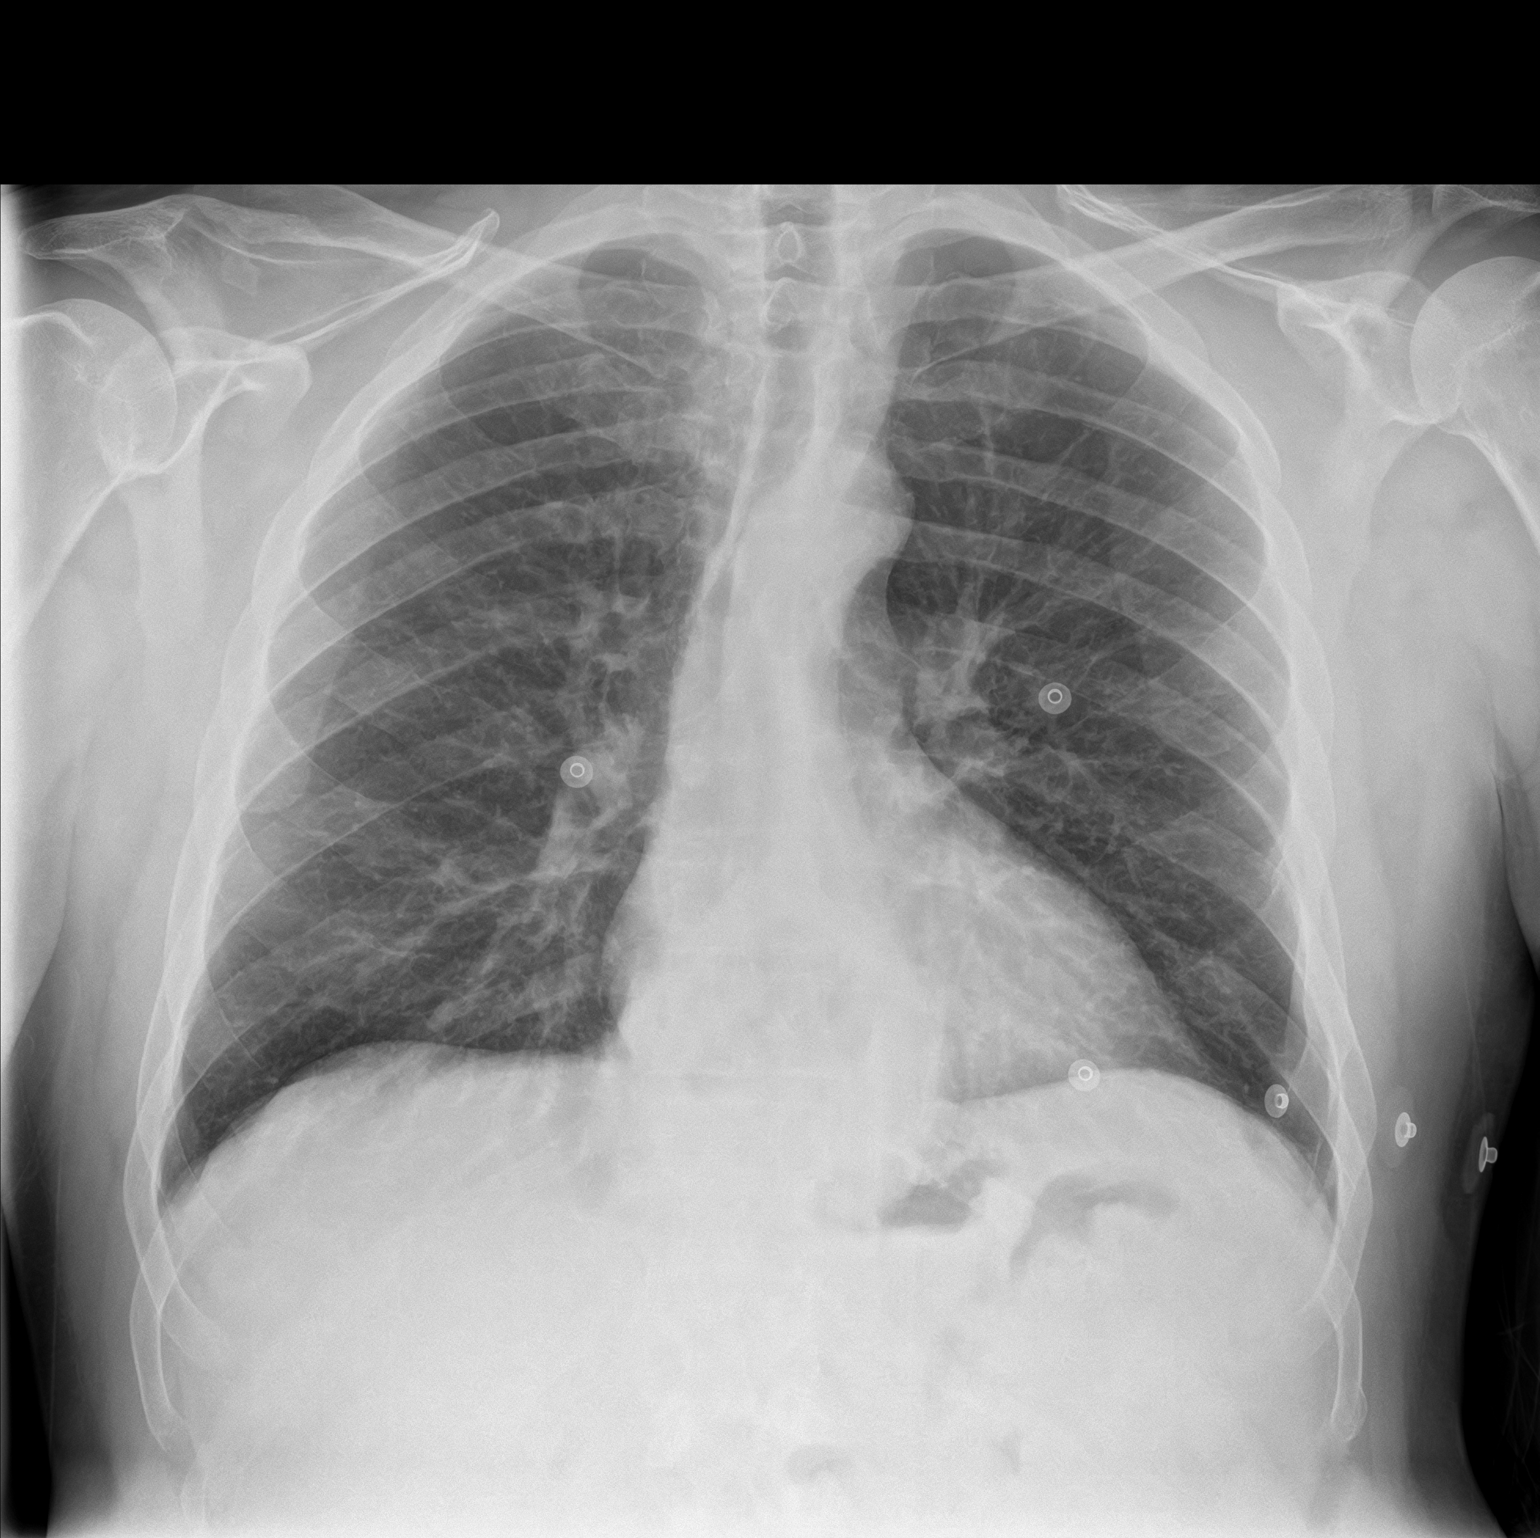

[chest lat]
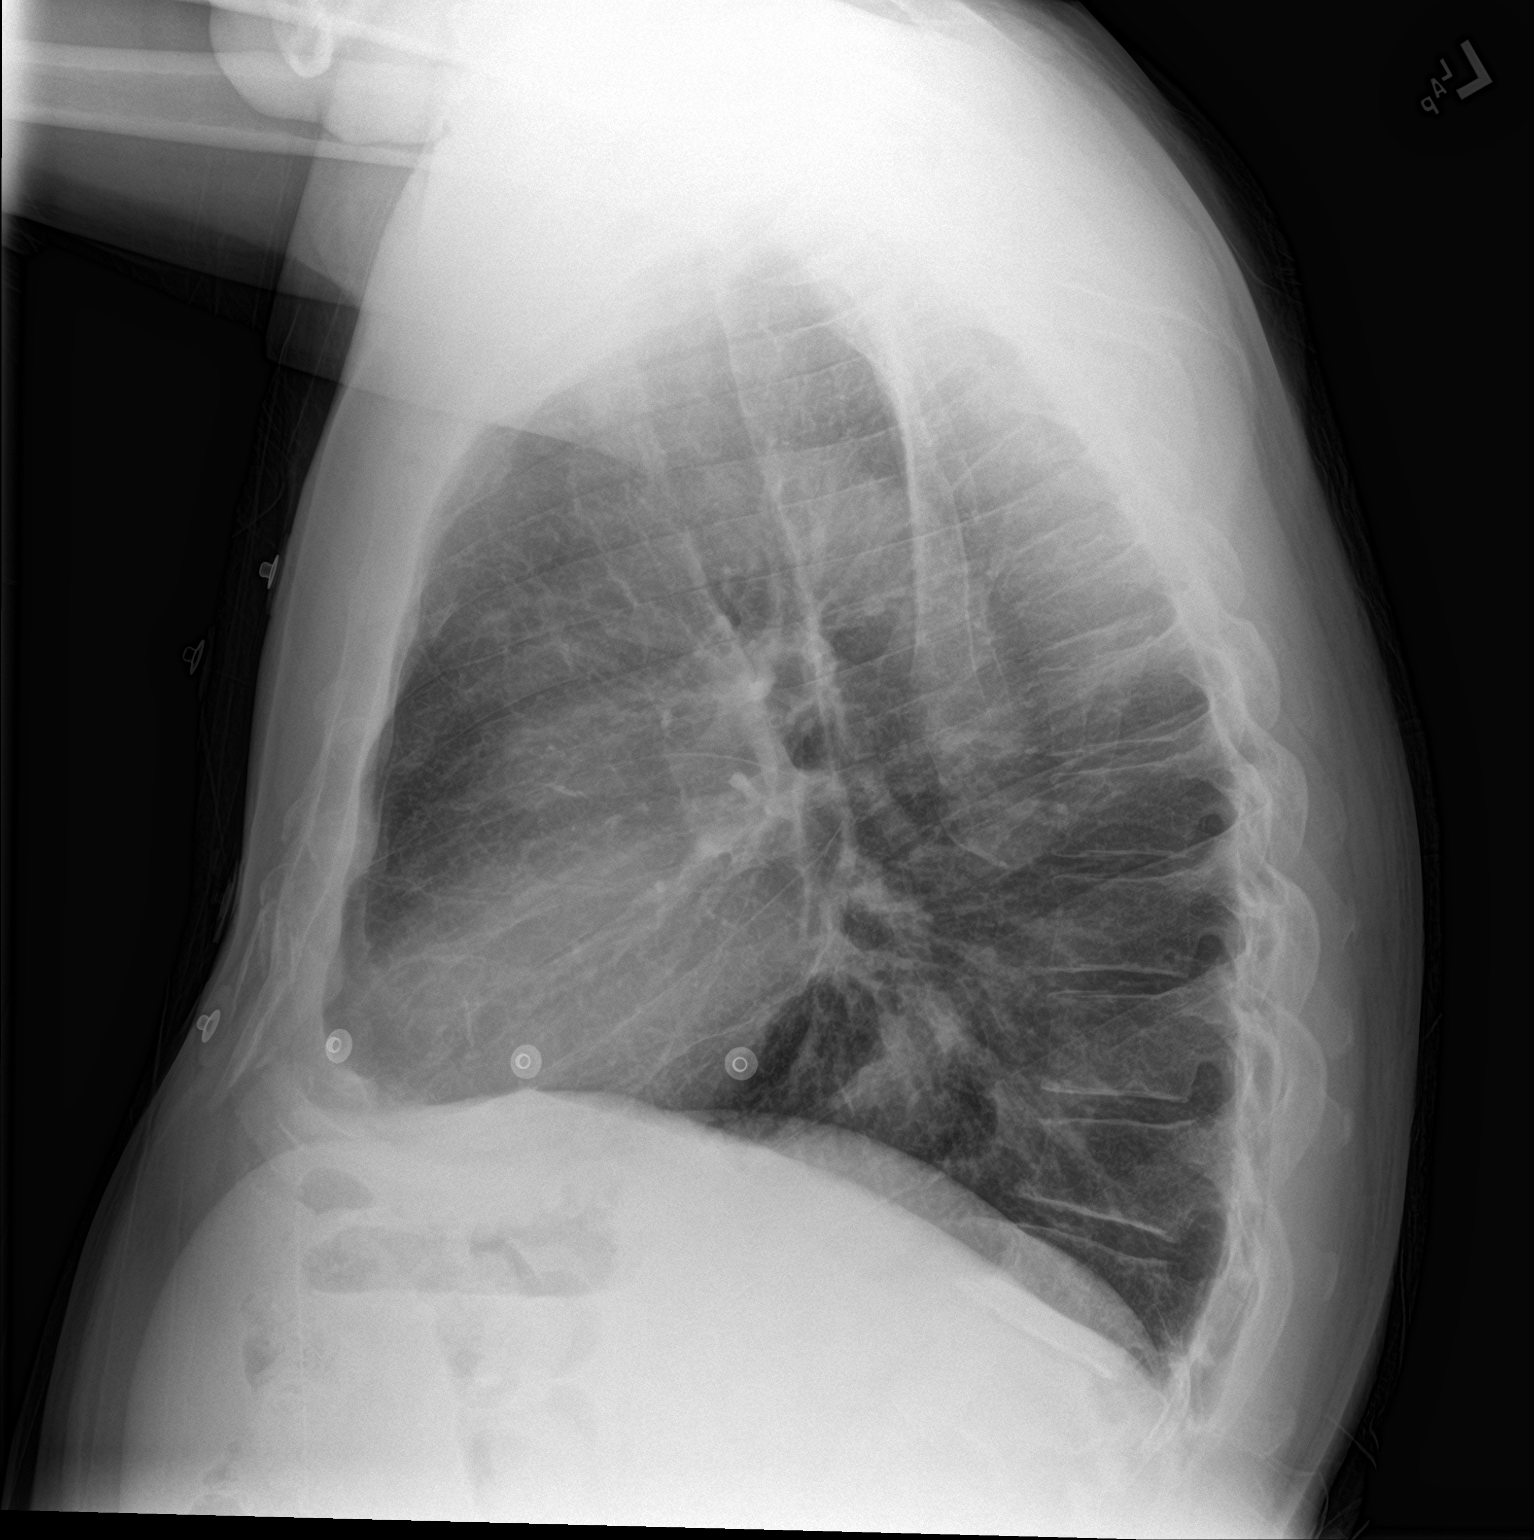

[2 of 2 positions shown; findings below may reference images not displayed]

FINDINGS: Mediastinum hilar structures normal. Lungs are clear. No pleural
effusion or pneumothorax. Heart size normal. Sliding hiatal hernia.
IMPRESSION: 1. No acute cardiopulmonary disease.

2. Sliding hiatal hernia.
